# Patient Record
Sex: Male | Born: 1960 | Race: Black or African American | Hispanic: No | Marital: Single | Smoking: Never smoker
Health system: Southern US, Community
[De-identification: ages and names within clinical notes are randomized; demographics above are authoritative.]

## PROBLEM LIST (undated history)

## (undated) HISTORY — PX: KNEE SURGERY: SHX244

## (undated) HISTORY — PX: SHOULDER SURGERY: SHX246

## (undated) HISTORY — PX: TONSILLECTOMY: SUR1361

## (undated) HISTORY — PX: ADENOIDECTOMY: SUR15

---

## 2007-08-28 ENCOUNTER — Emergency Department: Payer: Self-pay | Admitting: Emergency Medicine

## 2007-08-28 ENCOUNTER — Ambulatory Visit: Payer: Self-pay | Admitting: Family Medicine

## 2021-03-13 ENCOUNTER — Ambulatory Visit: Payer: Self-pay

## 2021-03-13 ENCOUNTER — Ambulatory Visit (INDEPENDENT_AMBULATORY_CARE_PROVIDER_SITE_OTHER): Payer: 59 | Admitting: Sports Medicine

## 2021-03-13 ENCOUNTER — Other Ambulatory Visit: Payer: Self-pay

## 2021-03-13 VITALS — BP 122/84 | Ht 71.0 in | Wt 202.0 lb

## 2021-03-13 DIAGNOSIS — G2589 Other specified extrapyramidal and movement disorders: Secondary | ICD-10-CM | POA: Diagnosis not present

## 2021-03-13 DIAGNOSIS — M25512 Pain in left shoulder: Secondary | ICD-10-CM

## 2021-03-13 NOTE — Progress Notes (Signed)
PCP: Albina Billet, MD  Subjective:   HPI: Patient is a 60 y.o. male here for evaluation of left shoulder pain x 2 months.  Kevin Conway is a very pleasant 60 year old male who presents today for left shoulder pain x 2 months.  Patient is very active and does a lot of physical activity such as swimming, biking weights, and crosstraining.  Over the last 2 months his left shoulder has been bothering him more so over the anterior lateral aspect.  He denies any inciting event or trauma.  He has cut back on swimming and some weighted activity over the last month or so and this has helped the shoulder to some degree.  He has no pain at rest, but will get pain with reaching overhead or moving the arm/shoulder in certain activities.  He denies any radicular pain or numbness and tingling.  No swelling, redness or ecchymosis.  He does state that he has had a history of shoulder issues in the past, he believes when he was in the [employer] about 13 years ago he did have a right shoulder scope where they cleaned some things out.  He went through physical therapy at that time and has not had any issues until now.  He is not taking any medication or exercises/PT for this before.  No past medical history on file.  No current outpatient medications on file prior to visit.   No current facility-administered medications on file prior to visit.    Not on File  BP 122/84   Ht 5\' 11"  (1.803 m)   Wt 202 lb (91.6 kg)   BMI 28.17 kg/m   No flowsheet data found.  No flowsheet data found.      Objective:  Physical Exam:  Shoulder, left: No skin changes, erythema, or ecchymosis noted. No evidence of bony deformity, asymmetry, or muscle atrophy; No tenderness over long head of biceps (bicipital groove). No TTP at [facility] joint. Full active and passive range of motion (180 flex Huel Cote /150Abd /90ER /70IR), Thumb to T12 without significant tenderness. There is mild pain with resisted abduction around 90d. Strength 5/5  throughout. There is a slight degree of abnormal scapular function observed, with the left scapular with mild retracted winging compared to the right. Sensation to light touch intact. Peripheral pulses intact.    Special Tests:     - Empty can: NEG   - Int/Ext Rotation test: NEG; although mild pain with resisted ER    - Dory Horn Lift-Off Test: NEG   - Crossarm Adduction test: NEG   - Neer test: NEG   - O'brien's test: NEG   - Yergason's: NEG   - Speeds test: Positive   - Apprehension test: No apprehension, although mild pain with end-range ER.  MSK Complete US of Shoulder, left  Patient was seated on exam table and shoulder US examination was performed using high frequency linear probe.  - The biceps tendon was visualized within the bicipital groove in both longitudinal and transverse axis with signs of calcific changes, there is also a degree of distal capsular distension superior to the tendon on short-axis. No target sign noted.  - The subscapularis tendon was visualized in both longitudinal and transverse axis with intact tendon inserting at the inferior lesser tubercle of the humerus with small degree of cortical irregularity although no evidence of gross tearing or tissue irregularity. -  The supraspinatus tendon was visualized in longitudinal, transverse, and dynamic views with evidence of minor degenerative tearing near the insertion  point on the articular side, spans approx 30% of the tendon.  - The infraspinatus and teres minor tendons were visualized without tendon irregularity or pathology.  - The posterior glenohumeral joint was visualized with proper alignment and no bursal distension. 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  IMPRESSION: Mild calcific tendinopathy within biceps tendon with associated distal capsular distension. Small Partial tear of the articular insertion of the  supraspinatus tendon.     Assessment & Plan:  1. Left shoulder pain - atraumatic x 2 months. Patient is very active and performs lot of overhead activity (swimming, weight lifting, cross-training). Exam and U/S demonstrates pathology of biceps tendon and partial supraspinatus degenerative tear near their crossing/insertion point.  2. Left scapular dyskinesis  - HEP (Spokes of the wheel, Int/Ext rotation, Scapular retraction, Eccentric light-weight curls, pronation-supination) x 6-8 weeks. We did discuss it takes about 12 weeks for RTC healing - May take Aleve or Ibuprofen as needed - Continue to exercise, but with activity modification, avoid heavy overhead, external rotation - F/u in 6-8 weeks if not improving  Elba Barman, DO PGY-4, [place]  I observed and examined the patient with the [facility] resident and agree with assessment and plan.  Note reviewed and modified by me. Ila Mcgill, MD

## 2021-05-25 ENCOUNTER — Ambulatory Visit (HOSPITAL_COMMUNITY)
Admission: RE | Admit: 2021-05-25 | Discharge: 2021-05-25 | Disposition: A | Discharge status: Home / Self Care | Payer: 59 | Source: Ambulatory Visit | Attending: Emergency Medicine | Admitting: Emergency Medicine

## 2021-05-25 ENCOUNTER — Ambulatory Visit
Admission: EM | Admit: 2021-05-25 | Discharge: 2021-05-25 | Disposition: A | Discharge status: Home / Self Care | Payer: 59 | Attending: Emergency Medicine | Admitting: Emergency Medicine

## 2021-05-25 ENCOUNTER — Other Ambulatory Visit: Payer: Self-pay

## 2021-05-25 DIAGNOSIS — W19XXXA Unspecified fall, initial encounter: Secondary | ICD-10-CM | POA: Diagnosis not present

## 2021-05-25 DIAGNOSIS — Y939 Activity, unspecified: Secondary | ICD-10-CM | POA: Diagnosis not present

## 2021-05-25 DIAGNOSIS — M25532 Pain in left wrist: Secondary | ICD-10-CM | POA: Diagnosis present

## 2021-05-25 DIAGNOSIS — S6992XA Unspecified injury of left wrist, hand and finger(s), initial encounter: Secondary | ICD-10-CM

## 2021-05-25 DIAGNOSIS — S62112A Displaced fracture of triquetrum [cuneiform] bone, left wrist, initial encounter for closed fracture: Secondary | ICD-10-CM | POA: Diagnosis not present

## 2021-05-25 NOTE — ED Provider Notes (Signed)
[facility]    CSN: [id number] Arrival date & time: 05/25/21  5621    HISTORY   Chief Complaint  Patient presents with   Wrist Pain    left   HPI Kevin Conway is a 61 y.o. male. Patient presents to urgent care complaining of fall on outstretched left hand, states this happened yesterday evening and initially did not hurt much but noticed this morning when he woke up that he was having pain in his left wrist when rotating wrist externally as well as when gripping and lifting with his left hand.  The history is provided by the patient.  History reviewed. No pertinent past medical history. There are no problems to display for this patient.  Past Surgical History:  Procedure Laterality Date   ADENOIDECTOMY     KNEE SURGERY     SHOULDER SURGERY     TONSILLECTOMY      Home Medications    Prior to Admission medications   Not on File    Family History History reviewed. No pertinent family history. Social History Social History   Tobacco Use   Smoking status: Never   Smokeless tobacco: Never  Substance Use Topics   Alcohol use: Yes   Allergies   Penicillins  Review of Systems Review of Systems Pertinent findings noted in history of present illness.   Physical Exam Triage Vital Signs ED Triage Vitals  Enc Vitals Group     BP 03/09/21 0827 (!) 147/82     Pulse Rate 03/09/21 0827 72     Resp 03/09/21 0827 18     Temp 03/09/21 0827 98.3 F (36.8 C)     Temp Source 03/09/21 0827 Oral     SpO2 03/09/21 0827 98 %     Weight --      Height --      Head Circumference --      Peak Flow --      Pain Score 03/09/21 0826 5     Pain Loc --      Pain Edu? --      Excl. in [place]? --   No data found.  Updated Vital Signs BP (!) 167/92 (BP Location: Left Arm)    Pulse 60    Temp 98 F (36.7 C)    Resp 12    Wt 202 lb (91.6 kg)    SpO2 96%    BMI 28.17 kg/m   Physical Exam Vitals and nursing note reviewed.  Constitutional:      General: He is not in  acute distress.    Appearance: Normal appearance. He is not ill-appearing.  HENT:     Head: Normocephalic and atraumatic.  Eyes:     General: Lids are normal.        Right eye: No discharge.        Left eye: No discharge.     Extraocular Movements: Extraocular movements intact.     Conjunctiva/sclera: Conjunctivae normal.     Right eye: Right conjunctiva is not injected.     Left eye: Left conjunctiva is not injected.  Neck:     Trachea: Trachea and phonation normal.  Cardiovascular:     Rate and Rhythm: Normal rate and regular rhythm.     Pulses: Normal pulses.     Heart sounds: Normal heart sounds. No murmur heard.   No friction rub. No gallop.  Pulmonary:     Effort: Pulmonary effort is normal. No accessory muscle usage, prolonged expiration or  respiratory distress.     Breath sounds: Normal breath sounds. No stridor, decreased air movement or transmitted upper airway sounds. No decreased breath sounds, wheezing, rhonchi or rales.  Chest:     Chest wall: No tenderness.  Musculoskeletal:        General: Tenderness (Dorsum left wrist laterally) present. Normal range of motion.     Cervical back: Normal range of motion and neck supple. Normal range of motion.  Lymphadenopathy:     Cervical: No cervical adenopathy.  Skin:    General: Skin is warm and dry.     Findings: No erythema or rash.  Neurological:     General: No focal deficit present.     Mental Status: He is alert and oriented to person, place, and time.  Psychiatric:        Mood and Affect: Mood normal.        Behavior: Behavior normal.    Visual Acuity Right Eye Distance:   Left Eye Distance:   Bilateral Distance:    Right Eye Near:   Left Eye Near:    Bilateral Near:     UC Couse / Diagnostics / Procedures:    EKG  Radiology DG Wrist Complete Left  Result Date: 05/25/2021 CLINICAL DATA:  [place] on outstretched hand less than 12 hours ago, pain at lateral aspect dorsum LEFT wrist EXAM: LEFT WRIST -  COMPLETE 3+ VIEW COMPARISON:  None FINDINGS: Osseous mineralization normal. Widening of scapholunate interval consistent with torn scapholunate ligament. Joint spaces otherwise preserved. Ulnar minus variance. Tiny avulsion fracture identified from dorsal margin of triquetrum. No additional fracture, dislocation, or bone destruction. Mild radial artery atherosclerotic calcification. IMPRESSION: Tiny avulsion fracture at dorsal margin of triquetrum. Ulnar minus variance. Widened scapholunate interval consistent with torn scapholunate ligament. Electronically Signed   By: Lavonia Dana M.D.   On: 05/25/2021 11:12    Procedures Procedures (including critical care time)  UC Diagnoses / Final Clinical Impressions(s)   I have reviewed the triage vital signs and the nursing notes.  Pertinent labs & imaging results that were available during my care of the patient were reviewed by me and considered in my medical decision making (see chart for details).    Final diagnoses:  Wrist injury, left, initial encounter   Physical exam findings concerning for fracture, patient advised that we are unable to perform x-rays at this facility due to not having a radiology technician, patient was agreeable to going to [facility] to have this done.  Patient given contact information for 2 walk-in orthopedic clinics should the x-ray result be positive for fracture.  ED Prescriptions   None    PDMP not reviewed this encounter.  Pending results:  Labs Reviewed - No data to display  Medications Ordered in UC: Medications - No data to display  Disposition Upon Discharge:  Condition: stable for discharge home Home: take medications as prescribed; routine discharge instructions as discussed; follow up as advised.  Patient presented with an acute illness with associated systemic symptoms and significant discomfort requiring urgent management. In my opinion, this is a condition that a prudent lay person (someone who  possesses an average knowledge of health and medicine) may potentially expect to result in complications if not addressed urgently such as respiratory distress, impairment of bodily function or dysfunction of bodily organs.   Routine symptom specific, illness specific and/or disease specific instructions were discussed with the patient and/or caregiver at length.   As such, the patient has been evaluated and  assessed, work-up was performed and treatment was provided in alignment with urgent care protocols and evidence based medicine.  Patient/parent/caregiver has been advised that the patient may require follow up for further testing and treatment if the symptoms continue in spite of treatment, as clinically indicated and appropriate.  If the patient was tested for COVID-19, Influenza and/or RSV, then the patient/parent/guardian was advised to isolate at home pending the results of his/her diagnostic coronavirus test and potentially longer if theyre positive. I have also advised pt that if his/her COVID-19 test returns positive, it's recommended to self-isolate for at least 10 days after symptoms first appeared AND until fever-free for 24 hours without fever reducer AND other symptoms have improved or resolved. Discussed self-isolation recommendations as well as instructions for household member/close contacts as per the [facility] and [place] DHHS, and also gave patient the [place] packet with this information.  Patient/parent/caregiver has been advised to return to the [facility] or PCP in 3-5 days if no better; to PCP or the Emergency Department if new signs and symptoms develop, or if the current signs or symptoms continue to change or worsen for further workup, evaluation and treatment as clinically indicated and appropriate  The patient will follow up with their current PCP if and as advised. If the patient does not currently have a PCP we will assist them in obtaining one.   The patient may need specialty follow up  if the symptoms continue, in spite of conservative treatment and management, for further workup, evaluation, consultation and treatment as clinically indicated and appropriate.   Patient/parent/caregiver verbalized understanding and agreement of plan as discussed.  All questions were addressed during visit.  Please see discharge instructions below for further details of plan.  Discharge Instructions:   Discharge Instructions      Please go to the [facility] main entrance to let them know that you have an x-ray ordered today.  As I mentioned, the results should be available within the hour, we will reach out to you by phone to let you know what the imaging showed.  If you need emergency orthopedic care, I provided you with the contact information for [facility].  It is best if you go in person rather than call to make an appointment as they have a walk-in clinic and do not schedule appointments for this.  Please keep your wrist wrapped until feeling better, NSAIDs and ice are the mainstay of care for sprains and tendon injuries.  If you have not had improvement of your pain in the next week to 10 days, if the imaging did not show a fracture, I do recommend that you follow-up with orthopedics anyway for further evaluation, possible MRI.      This office note has been dictated using Museum/gallery curator.  Unfortunately, and despite my best efforts, this method of dictation can sometimes lead to occasional typographical or grammatical errors.  I apologize in advance if this occurs.     Lynden Oxford Scales, PA-C 05/25/21 1257

## 2021-05-25 NOTE — Discharge Instructions (Addendum)
Please go to the [facility] main entrance to let them know that you have an x-ray ordered today.  As I mentioned, the results should be available within the hour, we will reach out to you by phone to let you know what the imaging showed.  If you need emergency orthopedic care, I provided you with the contact information for [facility].  It is best if you go in person rather than call to make an appointment as they have a walk-in clinic and do not schedule appointments for this.  Please keep your wrist wrapped until feeling better, NSAIDs and ice are the mainstay of care for sprains and tendon injuries.  If you have not had improvement of your pain in the next week to 10 days, if the imaging did not show a fracture, I do recommend that you follow-up with orthopedics anyway for further evaluation, possible MRI.

## 2021-05-25 NOTE — ED Triage Notes (Signed)
Pt presents with left wrist pain after a fall yesterday evening.

## 2021-08-01 ENCOUNTER — Other Ambulatory Visit: Payer: Self-pay

## 2021-08-01 ENCOUNTER — Ambulatory Visit
Admission: EM | Admit: 2021-08-01 | Discharge: 2021-08-01 | Disposition: A | Discharge status: Home / Self Care | Payer: 59

## 2021-08-01 DIAGNOSIS — L720 Epidermal cyst: Secondary | ICD-10-CM

## 2021-08-01 NOTE — ED Provider Notes (Signed)
[facility]    CSN: [id number] Arrival date & time: 08/01/21  1326    HISTORY   Chief Complaint  Patient presents with   Rash   HPI Kevin Conway is a 61 y.o. male. Pt c/o lump on his back that began to flare up on Thursday.  Patient states that about 25 years ago he acquired a superficial cut in that area which healed without incident.  Patient states that about a year ago, he noticed a bump rise up in that same area but did not think much about it.  Patient states that 5 days ago, his girlfriend advised him that the lump is getting bigger and looking a little bit red.  Patient denies pain from the lump, drainage from the lump.  Patient states that occasionally it does itch.  Patient states he does not have any similar lumps anywhere else on his body and has never had a lump like this before.  The history is provided by the patient.  History reviewed. No pertinent past medical history. There are no problems to display for this patient.  Past Surgical History:  Procedure Laterality Date   ADENOIDECTOMY     KNEE SURGERY     SHOULDER SURGERY     TONSILLECTOMY      Home Medications    Prior to Admission medications   Not on File    Family History History reviewed. No pertinent family history. Social History Social History   Tobacco Use   Smoking status: Never   Smokeless tobacco: Never  Substance Use Topics   Alcohol use: Yes   Allergies   Penicillins  Review of Systems Review of Systems Pertinent findings noted in history of present illness.   Physical Exam Triage Vital Signs ED Triage Vitals  Enc Vitals Group     BP 03/09/21 0827 (!) 147/82     Pulse Rate 03/09/21 0827 72     Resp 03/09/21 0827 18     Temp 03/09/21 0827 98.3 F (36.8 C)     Temp Source 03/09/21 0827 Oral     SpO2 03/09/21 0827 98 %     Weight --      Height --      Head Circumference --      Peak Flow --      Pain Score 03/09/21 0826 5     Pain Loc --      Pain Edu? --       Excl. in [place]? --   No data found.  Updated Vital Signs BP 139/90 (BP Location: Right Arm)   Pulse (!) 59   Temp 98.5 F (36.9 C) (Oral)   Resp 18   SpO2 96%   Physical Exam Vitals and nursing note reviewed.  Constitutional:      General: He is not in acute distress.    Appearance: Normal appearance. He is not ill-appearing.  HENT:     Head: Normocephalic and atraumatic.  Eyes:     General: Lids are normal.        Right eye: No discharge.        Left eye: No discharge.     Extraocular Movements: Extraocular movements intact.     Conjunctiva/sclera: Conjunctivae normal.     Right eye: Right conjunctiva is not injected.     Left eye: Left conjunctiva is not injected.  Neck:     Trachea: Trachea and phonation normal.  Cardiovascular:     Rate and Rhythm: Normal rate  and regular rhythm.     Pulses: Normal pulses.     Heart sounds: Normal heart sounds. No murmur heard.   No friction rub. No gallop.  Pulmonary:     Effort: Pulmonary effort is normal. No accessory muscle usage, prolonged expiration or respiratory distress.     Breath sounds: Normal breath sounds. No stridor, decreased air movement or transmitted upper airway sounds. No decreased breath sounds, wheezing, rhonchi or rales.  Chest:     Chest wall: No tenderness.  Musculoskeletal:        General: Normal range of motion.     Cervical back: Normal range of motion and neck supple. Normal range of motion.  Lymphadenopathy:     Cervical: No cervical adenopathy.  Skin:    General: Skin is warm and dry.     Findings: Lesion (Patient has a 2 cm epidermoid inclusion cyst with small black punctate that is mildly erythematous but not warm to touch.) present. No erythema or rash.  Neurological:     General: No focal deficit present.     Mental Status: He is alert and oriented to person, place, and time.  Psychiatric:        Mood and Affect: Mood normal.        Behavior: Behavior normal.    Visual Acuity Right Eye  Distance:   Left Eye Distance:   Bilateral Distance:    Right Eye Near:   Left Eye Near:    Bilateral Near:     UC Couse / Diagnostics / Procedures:    EKG  Radiology No results found.  Procedures Procedures (including critical care time)  UC Diagnoses / Final Clinical Impressions(s)   I have reviewed the triage vital signs and the nursing notes.  Pertinent labs & imaging results that were available during my care of the patient were reviewed by me and considered in my medical decision making (see chart for details).    Final diagnoses:  Epidermal inclusion cyst   Patient advised to reach out to his primary care provider for referral to dermatology to have the cyst removed.  Patient advised to go to the emergency room if the lesion becomes redder, hot or or begins to drain purulent fluid for further evaluation and emergent incision and drainage.  ED Prescriptions   None    PDMP not reviewed this encounter.  Pending results:  Labs Reviewed - No data to display  Medications Ordered in UC: Medications - No data to display  Disposition Upon Discharge:  Condition: stable for discharge home Home: take medications as prescribed; routine discharge instructions as discussed; follow up as advised.  Patient presented with an acute illness with associated systemic symptoms and significant discomfort requiring urgent management. In my opinion, this is a condition that a prudent lay person (someone who possesses an average knowledge of health and medicine) may potentially expect to result in complications if not addressed urgently such as respiratory distress, impairment of bodily function or dysfunction of bodily organs.   Routine symptom specific, illness specific and/or disease specific instructions were discussed with the patient and/or caregiver at length.   As such, the patient has been evaluated and assessed, work-up was performed and treatment was provided in alignment with  urgent care protocols and evidence based medicine.  Patient/parent/caregiver has been advised that the patient may require follow up for further testing and treatment if the symptoms continue in spite of treatment, as clinically indicated and appropriate.  If the patient was tested for  COVID-19, Influenza and/or RSV, then the patient/parent/guardian was advised to isolate at home pending the results of his/her diagnostic coronavirus test and potentially longer if theyre positive. I have also advised pt that if his/her COVID-19 test returns positive, it's recommended to self-isolate for at least 10 days after symptoms first appeared AND until fever-free for 24 hours without fever reducer AND other symptoms have improved or resolved. Discussed self-isolation recommendations as well as instructions for household member/close contacts as per the [facility] and [place] DHHS, and also gave patient the COVID packet with this information.  Patient/parent/caregiver has been advised to return to the [facility] or PCP in 3-5 days if no better; to PCP or the Emergency Department if new signs and symptoms develop, or if the current signs or symptoms continue to change or worsen for further workup, evaluation and treatment as clinically indicated and appropriate  The patient will follow up with their current PCP if and as advised. If the patient does not currently have a PCP we will assist them in obtaining one.   The patient may need specialty follow up if the symptoms continue, in spite of conservative treatment and management, for further workup, evaluation, consultation and treatment as clinically indicated and appropriate.   Patient/parent/caregiver verbalized understanding and agreement of plan as discussed.  All questions were addressed during visit.  Please see discharge instructions below for further details of plan.  Discharge Instructions:   Discharge Instructions      I have provided you with a patient education  handout for your information about epidermoid inclusion cysts and how they are removed.    Please reach out to your primary care provider to request a referral to a dermatologist.  In case you or your primary care doctor is not familiar with any dermatologist in this area, I have enclosed the contact information for 2 that are local here in [place] with whom I am not affiliated in any way.  In the meantime, no treatment or intervention is recommended as sometimes less is more.  Attempting to manipulate or lessen the size or redness of the area may actually end up aggravating it and making it worse.  Thank you for visiting urgent care today.  I appreciate the opportunity to participate in your care.    This office note has been dictated using Teaching laboratory technician.  Unfortunately, and despite my best efforts, this method of dictation can sometimes lead to occasional typographical or grammatical errors.  I apologize in advance if this occurs.     Theadora Rama Scales, PA-C 08/01/21 1501

## 2021-08-01 NOTE — ED Triage Notes (Signed)
Pt c/o skin irritation to back that began to flare up on Thursday. ?

## 2021-08-01 NOTE — Discharge Instructions (Addendum)
I have provided you with a patient education handout for your information about epidermoid inclusion cysts and how they are removed.   ? ?Please reach out to your primary care provider to request a referral to a dermatologist.  In case you or your primary care doctor is not familiar with any dermatologist in this area, I have enclosed the contact information for 2 that are local here in [place] with whom I am not affiliated in any way. ? ?In the meantime, no treatment or intervention is recommended as sometimes less is more.  Attempting to manipulate or lessen the size or redness of the area may actually end up aggravating it and making it worse. ? ?Thank you for visiting urgent care today.  I appreciate the opportunity to participate in your care. ?

## 2021-08-02 ENCOUNTER — Encounter: Payer: Self-pay | Admitting: Dermatology

## 2021-08-02 ENCOUNTER — Other Ambulatory Visit: Payer: Self-pay

## 2021-08-02 ENCOUNTER — Ambulatory Visit (INDEPENDENT_AMBULATORY_CARE_PROVIDER_SITE_OTHER): Payer: 59 | Admitting: Dermatology

## 2021-08-02 DIAGNOSIS — Z1283 Encounter for screening for malignant neoplasm of skin: Secondary | ICD-10-CM | POA: Diagnosis not present

## 2021-08-02 DIAGNOSIS — L729 Follicular cyst of the skin and subcutaneous tissue, unspecified: Secondary | ICD-10-CM

## 2021-08-02 MED ORDER — MINOCYCLINE HCL 50 MG PO TABS: 50.0000 mg | ORAL_TABLET | Freq: Two times a day (BID) | ORAL | 1 refills | Status: AC

## 2021-08-02 NOTE — Patient Instructions (Signed)
Please call insurance company that they will cover cyst removal. Give codes 11403 and 12031.  ?

## 2021-08-23 ENCOUNTER — Encounter: Payer: Self-pay | Admitting: Dermatology

## 2021-08-23 NOTE — Progress Notes (Signed)
? ?  New Patient ?  ?Subjective  ?Kevin Conway is a 61 y.o. male who presents for the following: New Patient (Initial Visit) (Mid back x years cyst not draining and its just getting larger ). ? ?Cyst on back ?Location:  ?Duration:  ?Quality:  ?Associated Signs/Symptoms: ?Modifying Factors:  ?Severity:  ?Timing: ?Context:  ? ? ?The following portions of the chart were reviewed this encounter and updated as appropriate:  Tobacco  Allergies  Meds  Problems  Med Hx  Surg Hx  Fam Hx   ?  ? ?Objective  ?Well appearing patient in no apparent distress; mood and affect are within normal limits. ?Waist up: No sign atypical pigmented lesions or nonmelanoma skin cancer. ? ?Right Upper Back ?2 cm cyst, noninflamed ? ? ? ?All skin waist up examined. ? ? ?Assessment & Plan  ?Cyst of skin ?Right Upper Back ? ?Patient told that removal is considered to be elective.  Potential risks include recurrence, unsightly scar, noncoverage by health insurance.  Pt will contact insurance company to verify coverage of removal. ? ?minocycline (DYNACIN) 50 MG tablet - Right Upper Back ?Take 1 tablet (50 mg total) by mouth 2 (two) times daily. ? ?Encounter for screening for malignant neoplasm of skin ? ?Recheck as needed. ? ? ?

## 2021-09-13 ENCOUNTER — Ambulatory Visit (INDEPENDENT_AMBULATORY_CARE_PROVIDER_SITE_OTHER): Payer: 59 | Admitting: Dermatology

## 2021-09-13 ENCOUNTER — Encounter: Payer: Self-pay | Admitting: Dermatology

## 2021-09-13 DIAGNOSIS — D485 Neoplasm of uncertain behavior of skin: Secondary | ICD-10-CM

## 2021-09-13 DIAGNOSIS — L720 Epidermal cyst: Secondary | ICD-10-CM

## 2021-09-13 NOTE — Patient Instructions (Signed)

## 2021-09-25 ENCOUNTER — Ambulatory Visit (INDEPENDENT_AMBULATORY_CARE_PROVIDER_SITE_OTHER): Payer: 59

## 2021-09-25 DIAGNOSIS — Z4802 Encounter for removal of sutures: Secondary | ICD-10-CM

## 2021-09-25 NOTE — Progress Notes (Signed)
NTS Suture removal, No s/s of infection, path to pt. 

## 2021-09-30 ENCOUNTER — Encounter: Payer: Self-pay | Admitting: Dermatology

## 2021-09-30 NOTE — Progress Notes (Signed)
   Follow-Up Visit   Subjective  Kevin Conway is a 61 y.o. male who presents for the following: Procedure (Here for cyst removal on back).  Patient requests removal of cyst on right upper back which has a history of inflammation. Location:  Duration:  Quality:  Associated Signs/Symptoms: Modifying Factors:  Severity:  Timing: Context:   Objective  Well appearing patient in no apparent distress; mood and affect are within normal limits. Mid  Upper Back  Mid  Upper Back Although nonfluctuant, this cyst is ill-defined and fibrotic.  Patient told before surgery with conservative excision, there will be a definite chance of recurrence.  He wished to proceed.    A focused examination was performed including back. Relevant physical exam findings are noted in the Assessment and Plan.   Assessment & Plan    Epidermoid cyst of skin of back Mid  Upper Back  3 x 1 cm ellipse, cyst found to be in pieces with thick scar tissue superiorly.  Dissected out to visibly clear base, layered closure, pressure dressing.  Skin excision - Mid  Upper Back  Lesion length (cm):  3 Lesion width (cm):  3 Margin per side (cm):  0 Total excision diameter (cm):  3 Informed consent: discussed and consent obtained   Timeout: patient name, date of birth, surgical site, and procedure verified   Anesthesia: the lesion was anesthetized in a standard fashion   Anesthetic:  1% lidocaine w/ epinephrine 1-100,000 local infiltration Instrument used: #15 blade   Hemostasis achieved with: pressure and electrodesiccation   Outcome: patient tolerated procedure well with no complications   Post-procedure details: sterile dressing applied and wound care instructions given   Dressing type: bandage, petrolatum and pressure dressing    Skin repair - Mid  Upper Back Complexity:  Intermediate Final length (cm):  3 Informed consent: discussed and consent obtained   Timeout: patient name, date of birth, surgical  site, and procedure verified   Procedure prep:  Patient was prepped and draped in usual sterile fashion (non sterile) Prep type:  Chlorhexidine Anesthesia: the lesion was anesthetized in a standard fashion   Reason for type of repair: reduce tension to allow closure, reduce the risk of dehiscence, infection, and necrosis and reduce subcutaneous dead space and avoid a hematoma   Undermining: edges undermined   Subcutaneous layers (deep stitches):  Suture size:  3-0 Suture type: Vicryl (polyglactin 910)   Fine/surface layer approximation (top stitches):  Suture size:  3-0 Suture type: nylon   Suture type comment:  Nylon Stitches: simple running   Suture removal (days):  12 Hemostasis achieved with: suture, pressure and electrodesiccation Outcome: patient tolerated procedure well with no complications   Post-procedure details: wound care instructions given   Post-procedure details comment:  Non sterile pressure  Additional details:  3-0 vicryl x 1 3-0 Ethilon x 3 top sutures &  2 mattress sutures  Specimen 1 - Surgical pathology Differential Diagnosis: r/o cyst  Check Margins: No      I, Lavonna Monarch, MD, have reviewed all documentation for this visit.  The documentation on 09/30/21 for the exam, diagnosis, procedures, and orders are all accurate and complete.

## 2022-10-21 ENCOUNTER — Ambulatory Visit
Admission: EM | Admit: 2022-10-21 | Discharge: 2022-10-21 | Disposition: A | Discharge status: Home / Self Care | Payer: 59

## 2022-10-21 DIAGNOSIS — H1031 Unspecified acute conjunctivitis, right eye: Secondary | ICD-10-CM | POA: Diagnosis not present

## 2022-10-21 MED ORDER — OFLOXACIN 0.3 % OP SOLN: 1.0000 [drp] | Freq: Four times a day (QID) | OPHTHALMIC | 0 refills | Status: AC

## 2022-10-21 NOTE — ED Provider Notes (Signed)
[facility]    CSN: [id number] Arrival date & time: 10/21/22  0802      History   Chief Complaint Chief Complaint  Patient presents with   Eye Pain    HPI Kevin Conway is a 62 y.o. male presents for evaluation of eye redness.  Patient reports 1 week ago he developed some right eye redness/irritation.  States it began after he was shoveling gravel outside.  Denies any foreign body sensation, purulent drainage, visual changes, eye pain.  Does endorse eye soreness with watery discharge.  Denies any injury today.  No glasses or contacts.  He has been using over-the-counter allergy eyedrops with minimal improvement.  No other concerns at this time.   Eye Pain    History reviewed. No pertinent past medical history.  There are no problems to display for this patient.   Past Surgical History:  Procedure Laterality Date   ADENOIDECTOMY     KNEE SURGERY     SHOULDER SURGERY     TONSILLECTOMY         Home Medications    Prior to Admission medications   Medication Sig Start Date End Date Taking? Authorizing Provider  ofloxacin (OCUFLOX) 0.3 % ophthalmic solution Place 1 drop into the right eye 4 (four) times daily for 7 days. 10/21/22 10/28/22 Yes Radford Pax, NP  chlorthalidone (HYGROTON) 25 MG tablet Take 25 mg by mouth every morning.    [provider]  minocycline (DYNACIN) 50 MG tablet Take 1 tablet (50 mg total) by mouth 2 (two) times daily. Patient not taking: Reported on 09/13/2021 08/02/21   Janalyn Harder, MD    Family History History reviewed. No pertinent family history.  Social History Social History   Tobacco Use   Smoking status: Never   Smokeless tobacco: Never  Vaping Use   Vaping Use: Never used  Substance Use Topics   Alcohol use: Yes   Drug use: Never     Allergies   Penicillins   Review of Systems Review of Systems  Eyes:  Positive for redness.     Physical Exam Triage Vital Signs ED Triage Vitals  Enc Vitals  Group     BP 10/21/22 0815 130/86     Pulse Rate 10/21/22 0815 (!) 53     Resp 10/21/22 0815 16     Temp 10/21/22 0815 97.9 F (36.6 C)     Temp Source 10/21/22 0815 Oral     SpO2 10/21/22 0815 97 %     Weight --      Height --      Head Circumference --      Peak Flow --      Pain Score 10/21/22 0817 3     Pain Loc --      Pain Edu? --      Excl. in GC? --    No data found.  Updated Vital Signs BP 130/86 (BP Location: Right Arm)   Pulse (!) 53   Temp 97.9 F (36.6 C) (Oral)   Resp 16   SpO2 97%   Visual Acuity Right Eye Distance:   Left Eye Distance:   Bilateral Distance:    Right Eye Near:   Left Eye Near:    Bilateral Near:     Physical Exam Vitals and nursing note reviewed.  Constitutional:      General: He is not in acute distress.    Appearance: Normal appearance. He is not ill-appearing.  HENT:  Head: Normocephalic and atraumatic.  Eyes:     General: Lids are normal.        Right eye: No foreign body, discharge or hordeolum.     Conjunctiva/sclera:     Right eye: Right conjunctiva is injected. Chemosis present. No exudate or hemorrhage. Cardiovascular:     Rate and Rhythm: Normal rate.  Pulmonary:     Effort: Pulmonary effort is normal.  Skin:    General: Skin is warm and dry.  Neurological:     General: No focal deficit present.     Mental Status: He is alert and oriented to person, place, and time.  Psychiatric:        Mood and Affect: Mood normal.        Behavior: Behavior normal.      UC Treatments / Results  Labs (all labs ordered are listed, but only abnormal results are displayed) Labs Reviewed - No data to display  EKG   Radiology No results found.  Procedures Procedures (including critical care time)  Medications Ordered in UC Medications - No data to display  Initial Impression / Assessment and Plan / UC Course  I have reviewed the triage vital signs and the nursing notes.  Pertinent labs & imaging results that  were available during my care of the patient were reviewed by me and considered in my medical decision making (see chart for details).     Reviewed exam and symptoms with patient.  Patient with conjunctivitis not responsive to over-the-counter antihistamine eyedrops.  Will do trial of ofloxacin.  Warm compresses to the eye as needed PCP follow-up if symptoms do not improve ER precautions reviewed and patient verbalized understanding Final Clinical Impressions(s) / UC Diagnoses   Final diagnoses:  Acute conjunctivitis of right eye, unspecified acute conjunctivitis type     Discharge Instructions      Start antibacterial eyedrops as prescribed Please follow-up with your PCP if your symptoms do not improve Please go to the ER if you develop any worsening symptoms   ED Prescriptions     Medication Sig Dispense Auth. Provider   ofloxacin (OCUFLOX) 0.3 % ophthalmic solution Place 1 drop into the right eye 4 (four) times daily for 7 days. 5 mL Radford Pax, NP      PDMP not reviewed this encounter.   Radford Pax, NP 10/21/22 [phone]

## 2022-10-21 NOTE — ED Triage Notes (Signed)
Pt presents to UC w/ c/o redness in right eye x1 week. Pt was shoveling gravel 1 week ago when it occurred the next day. Pt tried using eye drops which have not helped. Denies blurred vision

## 2022-10-21 NOTE — Discharge Instructions (Signed)
Start antibacterial eyedrops as prescribed Please follow-up with your PCP if your symptoms do not improve Please go to the ER if you develop any worsening symptoms

## 2023-07-26 IMAGING — CR DG WRIST COMPLETE 3+V*L*
4 series · 4 of 4 positions shown · non-contrast
Comparison: None

CLINICAL DATA: Fell on outstretched hand less than 12 hours ago,
pain at lateral aspect dorsum LEFT wrist

EXAM:
LEFT WRIST - COMPLETE 3+ VIEW

[wrist pa]
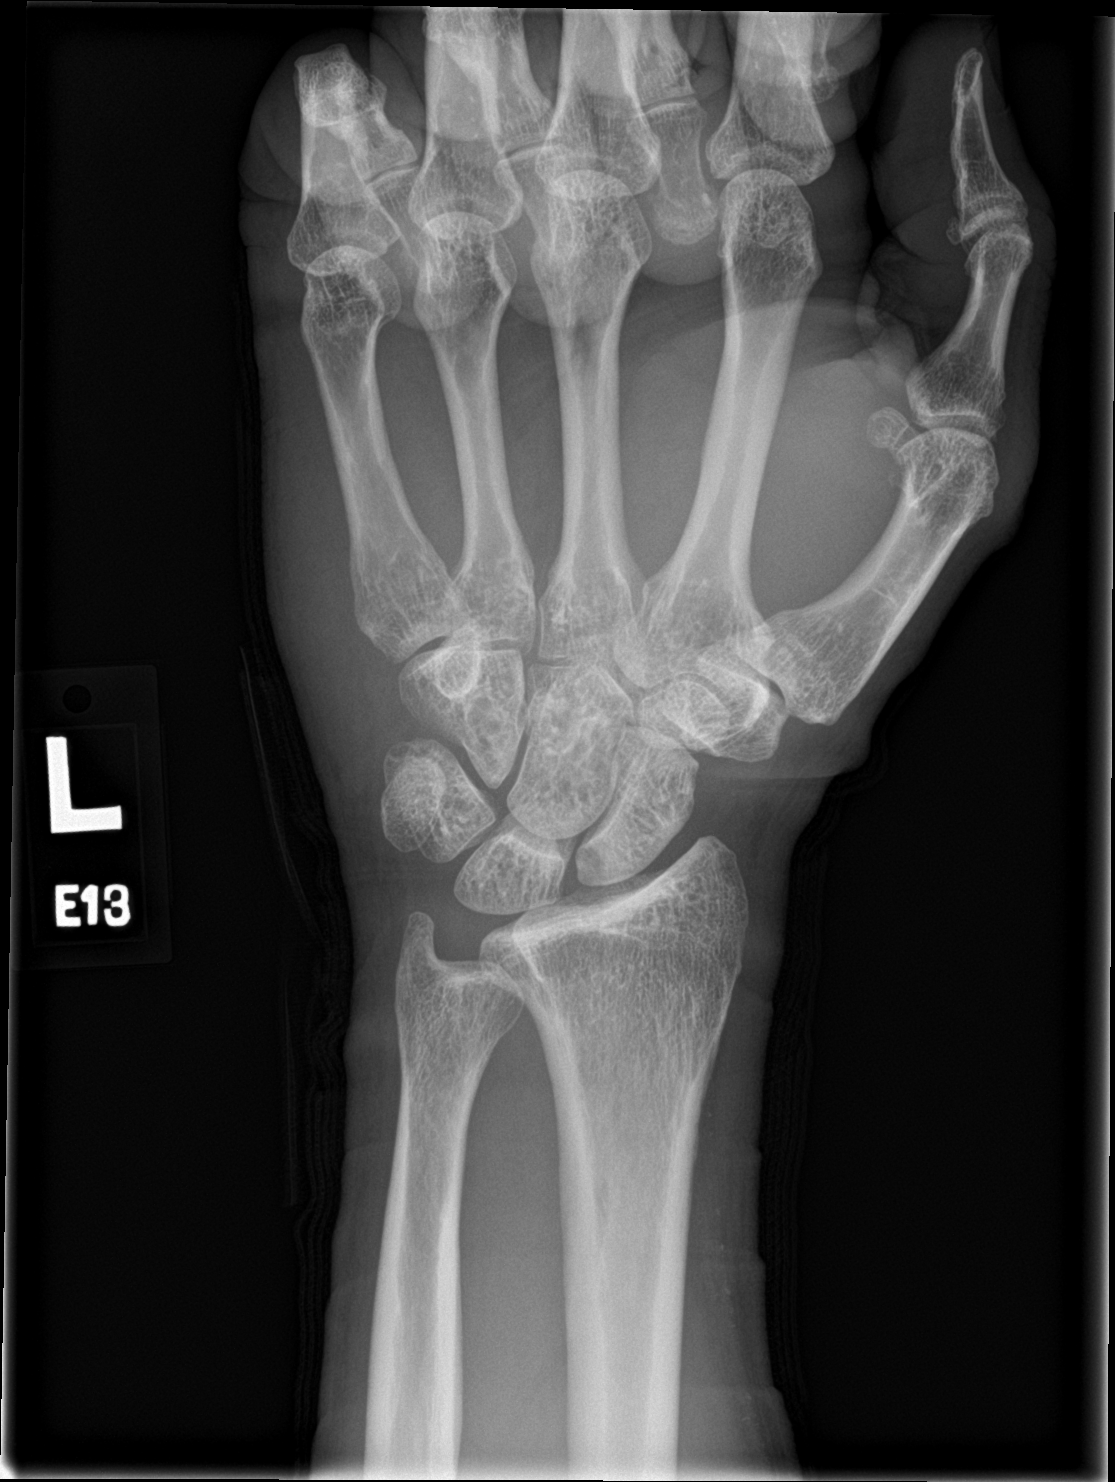

[wrist obl]
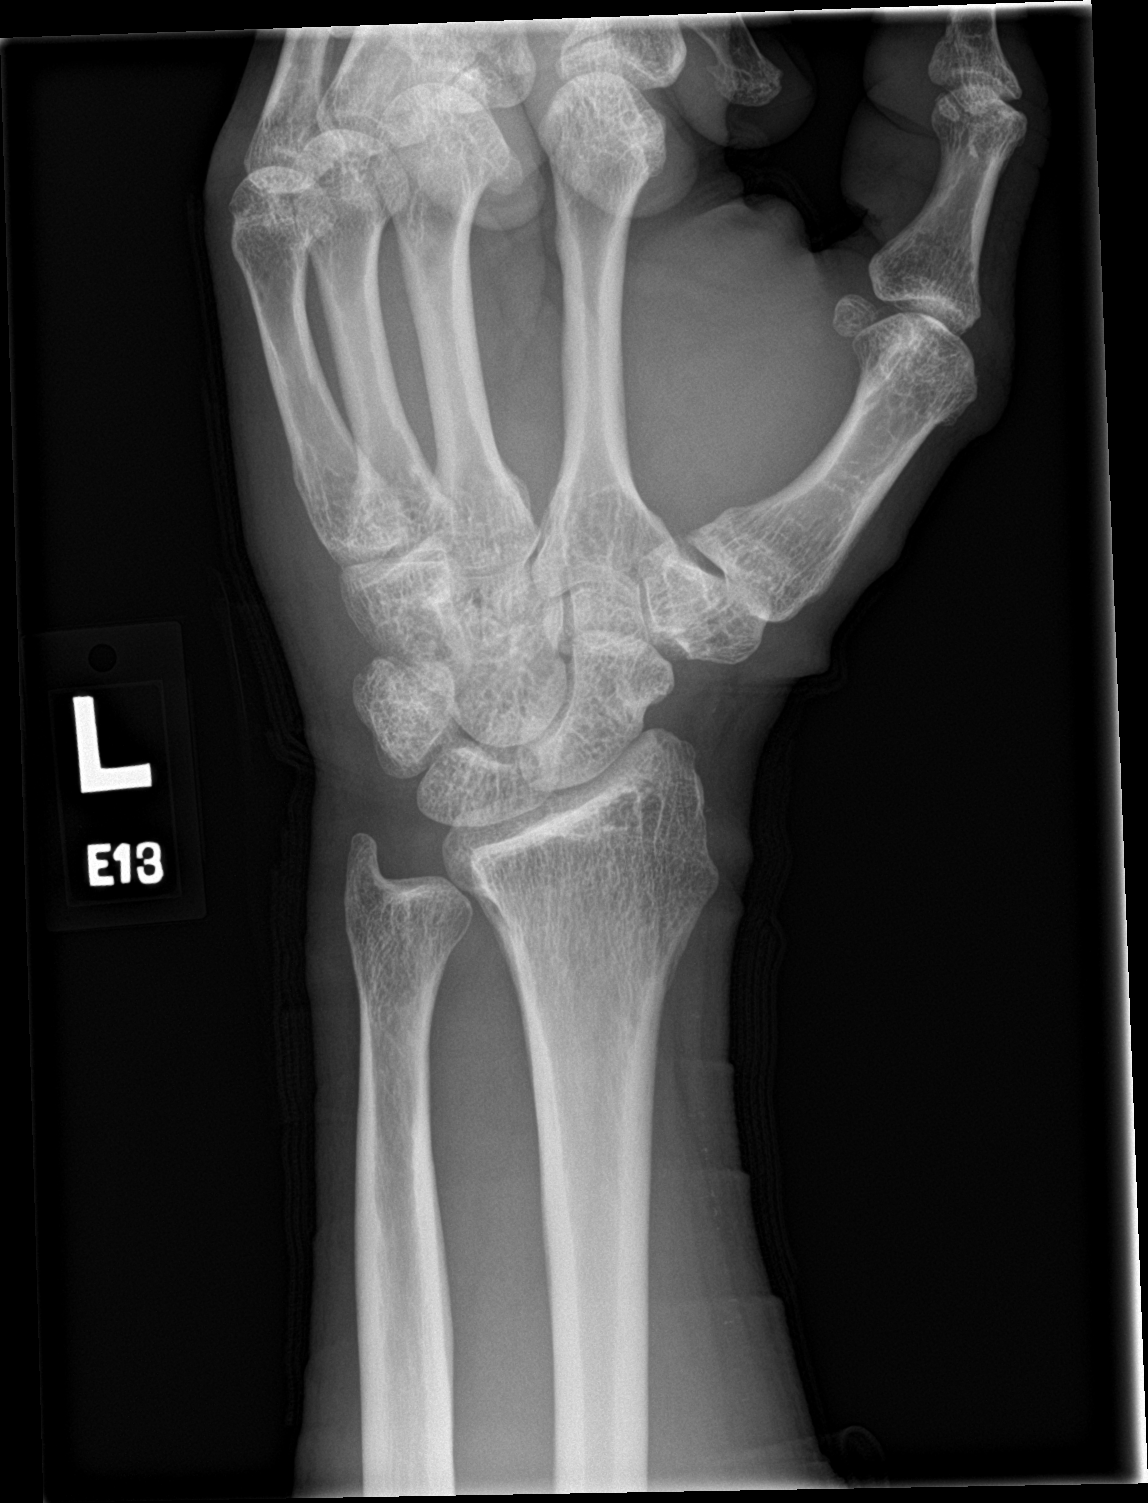

[wrist lat]
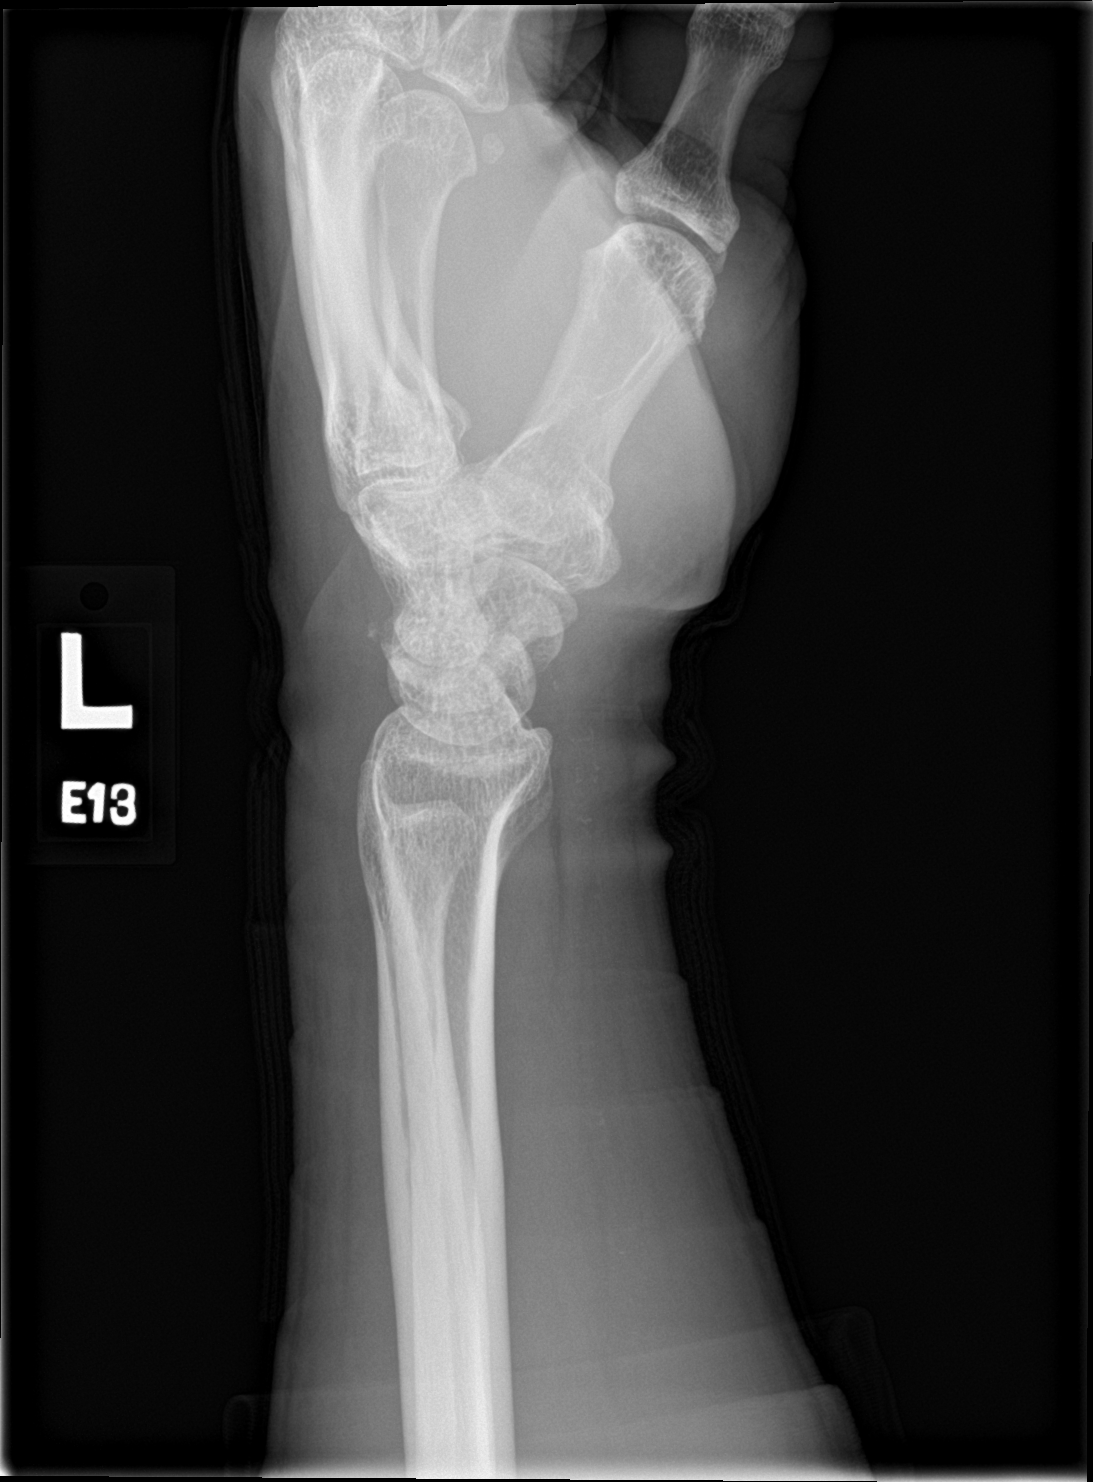

[wrist navicular]
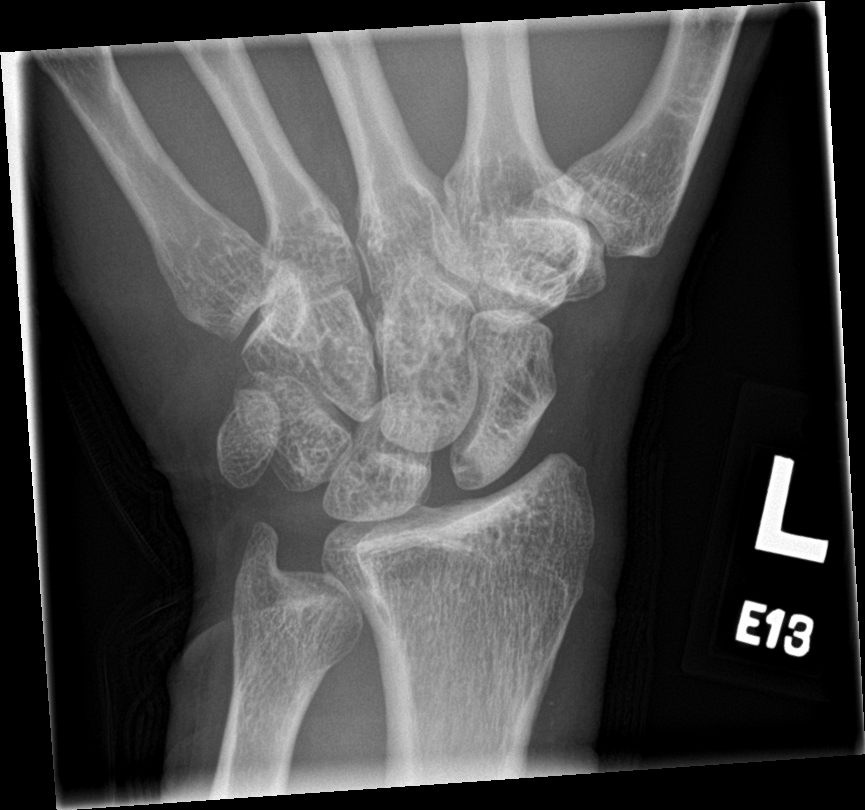

[4 of 4 positions shown; findings below may reference images not displayed]

FINDINGS: Osseous mineralization normal.

Widening of scapholunate interval consistent with torn scapholunate
ligament.

Joint spaces otherwise preserved.

Ulnar minus variance.

Tiny avulsion fracture identified from dorsal margin of triquetrum.

No additional fracture, dislocation, or bone destruction.

Mild radial artery atherosclerotic calcification.
IMPRESSION: Tiny avulsion fracture at dorsal margin of triquetrum.

Ulnar minus variance.

Widened scapholunate interval consistent with torn scapholunate
ligament.
